# Patient Record
Sex: Female | Born: 1975 | Race: White | Hispanic: No | Marital: Married | Smoking: Never smoker
Health system: Southern US, Community
[De-identification: ages and names within clinical notes are randomized; demographics above are authoritative.]

## PROBLEM LIST (undated history)

## (undated) HISTORY — PX: NO PAST SURGERIES: SHX2092

---

## 2012-03-28 ENCOUNTER — Ambulatory Visit: Payer: Self-pay | Admitting: Cardiovascular Disease

## 2015-05-21 DIAGNOSIS — G8929 Other chronic pain: Secondary | ICD-10-CM | POA: Insufficient documentation

## 2015-05-21 DIAGNOSIS — D500 Iron deficiency anemia secondary to blood loss (chronic): Secondary | ICD-10-CM | POA: Insufficient documentation

## 2015-05-21 DIAGNOSIS — M545 Low back pain, unspecified: Secondary | ICD-10-CM | POA: Insufficient documentation

## 2015-05-21 DIAGNOSIS — J301 Allergic rhinitis due to pollen: Secondary | ICD-10-CM | POA: Insufficient documentation

## 2015-05-23 DIAGNOSIS — F5104 Psychophysiologic insomnia: Secondary | ICD-10-CM | POA: Insufficient documentation

## 2015-08-30 DIAGNOSIS — L659 Nonscarring hair loss, unspecified: Secondary | ICD-10-CM | POA: Insufficient documentation

## 2015-12-11 ENCOUNTER — Other Ambulatory Visit: Payer: Self-pay | Admitting: Pediatrics

## 2015-12-11 DIAGNOSIS — Z1231 Encounter for screening mammogram for malignant neoplasm of breast: Secondary | ICD-10-CM

## 2015-12-14 ENCOUNTER — Ambulatory Visit
Admission: EM | Admit: 2015-12-14 | Discharge: 2015-12-14 | Disposition: A | Discharge status: Home / Self Care | Payer: Federal, State, Local not specified - PPO | Attending: Family Medicine | Admitting: Family Medicine

## 2015-12-14 DIAGNOSIS — Z803 Family history of malignant neoplasm of breast: Secondary | ICD-10-CM | POA: Insufficient documentation

## 2015-12-14 DIAGNOSIS — R251 Tremor, unspecified: Secondary | ICD-10-CM | POA: Diagnosis not present

## 2015-12-14 DIAGNOSIS — Z79899 Other long term (current) drug therapy: Secondary | ICD-10-CM | POA: Diagnosis not present

## 2015-12-14 DIAGNOSIS — R200 Anesthesia of skin: Secondary | ICD-10-CM | POA: Insufficient documentation

## 2015-12-14 LAB — BASIC METABOLIC PANEL
ANION GAP: 7 (ref 5–15)
BUN: 13 mg/dL (ref 6–20)
CALCIUM: 8.8 mg/dL — AB (ref 8.9–10.3)
CO2: 24 mmol/L (ref 22–32)
CREATININE: 0.66 mg/dL (ref 0.44–1.00)
Chloride: 105 mmol/L (ref 101–111)
Glucose, Bld: 93 mg/dL (ref 65–99)
Potassium: 3.7 mmol/L (ref 3.5–5.1)
SODIUM: 136 mmol/L (ref 135–145)

## 2015-12-14 LAB — CBC WITH DIFFERENTIAL/PLATELET
BASOS ABS: 0 10*3/uL (ref 0–0.1)
BASOS PCT: 1 %
EOS ABS: 0 10*3/uL (ref 0–0.7)
Eosinophils Relative: 1 %
HEMATOCRIT: 31.8 % — AB (ref 35.0–47.0)
Hemoglobin: 10.4 g/dL — ABNORMAL LOW (ref 12.0–16.0)
Lymphocytes Relative: 25 %
Lymphs Abs: 1.1 10*3/uL (ref 1.0–3.6)
MCH: 27.5 pg (ref 26.0–34.0)
MCHC: 32.5 g/dL (ref 32.0–36.0)
MCV: 84.7 fL (ref 80.0–100.0)
MONO ABS: 0.3 10*3/uL (ref 0.2–0.9)
MONOS PCT: 8 %
NEUTROS ABS: 2.8 10*3/uL (ref 1.4–6.5)
Neutrophils Relative %: 65 %
PLATELETS: 188 10*3/uL (ref 150–440)
RBC: 3.76 MIL/uL — ABNORMAL LOW (ref 3.80–5.20)
RDW: 14.8 % — AB (ref 11.5–14.5)
WBC: 4.2 10*3/uL (ref 3.6–11.0)

## 2015-12-14 LAB — GLUCOSE, CAPILLARY: GLUCOSE-CAPILLARY: 129 mg/dL — AB (ref 65–99)

## 2015-12-14 NOTE — ED Provider Notes (Addendum)
[facility]    CSN: [id number] Arrival date & time: 12/14/15  1345     History   Chief Complaint Chief Complaint  Patient presents with  . Shaking    HPI Julia Hendrix is a 40 y.o. female.   40 yo female with a c/o "shakiness" this morning. States that she has some facial numbness bilaterally and above eyes. Patient states that she feels that she feels like her whole body is falling asleep. Denies vision changes, one-sided weakness, fevers, chills. States normally doesn't drink [employer] coffee and this morning had a strong cofffee drink there. Denies syncope, chest pains, shortness of breath.             The history is provided by the patient.    History reviewed. No pertinent past medical history.  There are no active problems to display for this patient.   Past Surgical History:  Procedure Laterality Date  . NO PAST SURGERIES      OB History    No data available       Home Medications    Prior to Admission medications   Medication Sig Start Date End Date Taking? Authorizing Provider  escitalopram (LEXAPRO) 10 MG tablet Take 10 mg by mouth daily.   Yes Historical Provider, MD    Family History Family History  Problem Relation Age of Onset  . Breast cancer Maternal Aunt 60    Social History Social History  Substance Use Topics  . Smoking status: Never Smoker  . Smokeless tobacco: Never Used  . Alcohol use Yes     Comment: red wine     Allergies   Review of patient's allergies indicates no known allergies.   Review of Systems Review of Systems   Physical Exam Triage Vital Signs ED Triage Vitals  Enc Vitals Group     BP 12/14/15 1348 132/81     Pulse Rate 12/14/15 1348 94     Resp 12/14/15 1348 20     Temp 12/14/15 1348 98.2 F (36.8 C)     Temp Source 12/14/15 1348 Tympanic     SpO2 12/14/15 1348 100 %     Weight 12/14/15 1348 140 lb (63.5 kg)     Height 12/14/15 1348 5\' 9"  (1.753 m)     Head Circumference --     Peak Flow --      Pain Score 12/14/15 1350 0     Pain Loc --      Pain Edu? --      Excl. in [place]? --    No data found.   Updated Vital Signs BP 132/81 (BP Location: Left Arm)   Pulse 94   Temp 98.2 F (36.8 C) (Tympanic)   Resp 20   Ht 5\' 9"  (1.753 m)   Wt 140 lb (63.5 kg)   LMP 11/30/2015   SpO2 100%   BMI 20.67 kg/m   Visual Acuity Right Eye Distance:   Left Eye Distance:   Bilateral Distance:    Right Eye Near:   Left Eye Near:    Bilateral Near:     Physical Exam  Constitutional: She is oriented to person, place, and time. She appears well-developed and well-nourished. No distress.  HENT:  Head: Normocephalic and atraumatic.  Right Ear: Tympanic membrane, external ear and ear canal normal.  Left Ear: Tympanic membrane, external ear and ear canal normal.  Nose: Mucosal edema and rhinorrhea present. No nose lacerations, sinus tenderness, nasal deformity, septal deviation or nasal septal  hematoma. No epistaxis.  No foreign bodies. Right sinus exhibits maxillary sinus tenderness and frontal sinus tenderness. Left sinus exhibits maxillary sinus tenderness and frontal sinus tenderness.  Mouth/Throat: Uvula is midline, oropharynx is clear and moist and mucous membranes are normal. No oropharyngeal exudate.  Eyes: Conjunctivae and EOM are normal. Pupils are equal, round, and reactive to light. Right eye exhibits no discharge. Left eye exhibits no discharge. No scleral icterus.  Neck: Normal range of motion. Neck supple. No thyromegaly present.  Cardiovascular: Normal rate, regular rhythm and normal heart sounds.   Pulmonary/Chest: Effort normal and breath sounds normal. No respiratory distress. She has no wheezes. She has no rales.  Lymphadenopathy:    She has no cervical adenopathy.  Neurological: She is alert and oriented to person, place, and time. She has normal reflexes. She displays normal reflexes. No cranial nerve deficit. She exhibits normal muscle tone. Coordination  normal.  Skin: No rash noted. She is not diaphoretic.  Nursing note and vitals reviewed.    UC Treatments / Results  Labs (all labs ordered are listed, but only abnormal results are displayed) Labs Reviewed  GLUCOSE, CAPILLARY - Abnormal; Notable for the following:       Result Value   Glucose-Capillary 129 (*)    All other components within normal limits  CBC WITH DIFFERENTIAL/PLATELET - Abnormal; Notable for the following:    RBC 3.76 (*)    Hemoglobin 10.4 (*)    HCT 31.8 (*)    RDW 14.8 (*)    All other components within normal limits  BASIC METABOLIC PANEL - Abnormal; Notable for the following:    Calcium 8.8 (*)    All other components within normal limits  CBG MONITORING, ED    EKG  EKG Interpretation None       Radiology No results found.  Procedures .EKG Date/Time: 12/25/2015 8:58 PM Performed by: Payton MccallumONTY, Loomis Anacker Authorized by: Payton MccallumONTY, Nakyah Erdmann   ECG reviewed by ED Physician in the absence of a cardiologist: yes   Previous ECG:    Previous ECG:  Unavailable Interpretation:    Interpretation: normal   Rate:    ECG rate assessment: normal   Rhythm:    Rhythm: sinus rhythm   Ectopy:    Ectopy: none   QRS:    QRS axis:  Normal Conduction:    Conduction: normal   ST segments:    ST segments:  Normal T waves:    T waves: normal     (including critical care time)  Medications Ordered in UC Medications - No data to display   Initial Impression / Assessment and Plan / UC Course  I have reviewed the triage vital signs and the nursing notes.  Pertinent labs & imaging results that were available during my care of the patient were reviewed by me and considered in my medical decision making (see chart for details).  Clinical Course     Final Clinical Impressions(s) / UC Diagnoses   Final diagnoses:  Shakiness  (possibly secondary to caffeine?)  New Prescriptions Discharge Medication List as of 12/14/2015  2:46 PM     1. Lab results and  diagnosis reviewed with patient 2.Recommend supportive treatment with increased fluids, rest 4. Follow-up with PCP or prn if symptoms worsen or don't improve   Payton Mccallumrlando Rance Smithson, MD 12/25/15 [id number]    Payton Mccallumrlando Allie Ousley, MD 12/25/15 2059

## 2015-12-14 NOTE — ED Triage Notes (Signed)
Patient complains of shakiness that started this morning. Patient states that she has some facial numbness bilaterally and above eyes. Patient states that she has had some base of her neck pain. Patient states that she feels that she feels like her whole body is falling asleep.

## 2015-12-18 ENCOUNTER — Ambulatory Visit
Admission: RE | Admit: 2015-12-18 | Discharge: 2015-12-18 | Disposition: A | Discharge status: Home / Self Care | Payer: Federal, State, Local not specified - PPO | Source: Ambulatory Visit | Attending: Pediatrics | Admitting: Pediatrics

## 2015-12-18 ENCOUNTER — Other Ambulatory Visit: Payer: Self-pay | Admitting: Pediatrics

## 2015-12-18 DIAGNOSIS — Z1231 Encounter for screening mammogram for malignant neoplasm of breast: Secondary | ICD-10-CM | POA: Diagnosis not present

## 2016-04-23 HISTORY — PX: INTRAUTERINE DEVICE (IUD) INSERTION: SHX5877

## 2016-05-12 DIAGNOSIS — Z975 Presence of (intrauterine) contraceptive device: Secondary | ICD-10-CM | POA: Insufficient documentation

## 2016-05-21 ENCOUNTER — Encounter: Payer: Self-pay | Admitting: Advanced Practice Midwife

## 2016-05-21 ENCOUNTER — Ambulatory Visit (INDEPENDENT_AMBULATORY_CARE_PROVIDER_SITE_OTHER): Payer: Federal, State, Local not specified - PPO | Admitting: Advanced Practice Midwife

## 2016-05-21 VITALS — BP 116/54 | HR 75 | Ht 69.0 in | Wt 156.0 lb

## 2016-05-21 DIAGNOSIS — Z30431 Encounter for routine checking of intrauterine contraceptive device: Secondary | ICD-10-CM

## 2016-05-21 DIAGNOSIS — F419 Anxiety disorder, unspecified: Secondary | ICD-10-CM | POA: Insufficient documentation

## 2016-05-21 NOTE — Progress Notes (Signed)
       IUD String Check  Subjctive: Julia Hendrix presents for IUD string check.  She had a Mirena placed 4 weeks ago.  Since placement of her IUD she had 13 days vaginal bleeding.  She denies cramping or discomfort.  She has had intercourse since placement.  She has not checked the strings.  She denies any fever, chills, nausea, vomiting, or other complaints.    Objective: BP (!) 116/54 (BP Location: Left Arm, Patient Position: Sitting, Cuff Size: Normal)   Pulse 75   Ht 5\' 9"  (1.753 m)   Wt 156 lb (70.8 kg)   LMP 05/09/2016 (Exact Date)   BMI 23.04 kg/m  Gen:  NAD, A&Ox3 HEENT: normocephalic, anicteric Pulmonary: no increased work of breathing Pelvic: Normal appearing external female genitalia, normal vaginal epithelium, no abnormal discharge. Normal appearing cervix.  IUD strings visible and 3 cm in length similar to at the time of placement. Psychiatric: mood appropriate, affect full Neurologic: grossly normal  Assessment: 41 y.o. year old female status post prior Mirena IUD placement 4 weeks ago, doing well.  Plan: 1.  The patient was given instructions to check her IUD strings monthly and call with any problems or concerns.  She should call for fevers, chills, abnormal vaginal discharge, pelvic pain, or other complaints. 2.  She will return for a annual exam in 1 year.  All questions answered.  Julia Hendrix, CNM 05/21/2016 1:59 PM

## 2017-01-19 ENCOUNTER — Other Ambulatory Visit: Payer: Self-pay | Admitting: Pediatrics

## 2017-01-19 DIAGNOSIS — Z1231 Encounter for screening mammogram for malignant neoplasm of breast: Secondary | ICD-10-CM

## 2017-02-09 ENCOUNTER — Encounter (INDEPENDENT_AMBULATORY_CARE_PROVIDER_SITE_OTHER): Payer: Self-pay

## 2017-02-09 ENCOUNTER — Ambulatory Visit
Admission: RE | Admit: 2017-02-09 | Discharge: 2017-02-09 | Disposition: A | Discharge status: Home / Self Care | Payer: Federal, State, Local not specified - PPO | Source: Ambulatory Visit | Attending: Pediatrics | Admitting: Pediatrics

## 2017-02-09 DIAGNOSIS — Z1231 Encounter for screening mammogram for malignant neoplasm of breast: Secondary | ICD-10-CM | POA: Diagnosis not present

## 2017-03-04 DIAGNOSIS — T691XXA Chilblains, initial encounter: Secondary | ICD-10-CM | POA: Insufficient documentation

## 2017-03-04 DIAGNOSIS — L301 Dyshidrosis [pompholyx]: Secondary | ICD-10-CM | POA: Insufficient documentation

## 2018-04-05 ENCOUNTER — Other Ambulatory Visit: Payer: Self-pay | Admitting: Pediatrics

## 2018-04-05 DIAGNOSIS — Z1231 Encounter for screening mammogram for malignant neoplasm of breast: Secondary | ICD-10-CM

## 2018-04-07 ENCOUNTER — Ambulatory Visit
Admission: RE | Admit: 2018-04-07 | Discharge: 2018-04-07 | Disposition: A | Discharge status: Home / Self Care | Payer: Federal, State, Local not specified - PPO | Source: Ambulatory Visit | Attending: Pediatrics | Admitting: Pediatrics

## 2018-04-07 ENCOUNTER — Encounter (INDEPENDENT_AMBULATORY_CARE_PROVIDER_SITE_OTHER): Payer: Self-pay

## 2018-04-07 DIAGNOSIS — Z1231 Encounter for screening mammogram for malignant neoplasm of breast: Secondary | ICD-10-CM | POA: Insufficient documentation

## 2018-04-18 ENCOUNTER — Ambulatory Visit: Payer: Federal, State, Local not specified - PPO | Admitting: Advanced Practice Midwife

## 2019-05-15 ENCOUNTER — Other Ambulatory Visit: Payer: Self-pay | Admitting: Pediatrics

## 2019-05-15 DIAGNOSIS — Z1231 Encounter for screening mammogram for malignant neoplasm of breast: Secondary | ICD-10-CM

## 2019-05-17 ENCOUNTER — Other Ambulatory Visit: Payer: Self-pay

## 2019-05-17 ENCOUNTER — Ambulatory Visit
Admission: RE | Admit: 2019-05-17 | Discharge: 2019-05-17 | Disposition: A | Discharge status: Home / Self Care | Payer: Federal, State, Local not specified - PPO | Source: Ambulatory Visit | Attending: Pediatrics | Admitting: Pediatrics

## 2019-05-17 DIAGNOSIS — Z1231 Encounter for screening mammogram for malignant neoplasm of breast: Secondary | ICD-10-CM | POA: Diagnosis not present

## 2019-06-22 ENCOUNTER — Other Ambulatory Visit (HOSPITAL_COMMUNITY)
Admission: RE | Admit: 2019-06-22 | Discharge: 2019-06-22 | Disposition: A | Discharge status: Home / Self Care | Payer: Federal, State, Local not specified - PPO | Source: Ambulatory Visit | Attending: Advanced Practice Midwife | Admitting: Advanced Practice Midwife

## 2019-06-22 ENCOUNTER — Other Ambulatory Visit: Payer: Self-pay

## 2019-06-22 ENCOUNTER — Ambulatory Visit (INDEPENDENT_AMBULATORY_CARE_PROVIDER_SITE_OTHER): Payer: Federal, State, Local not specified - PPO | Admitting: Advanced Practice Midwife

## 2019-06-22 ENCOUNTER — Encounter: Payer: Self-pay | Admitting: Advanced Practice Midwife

## 2019-06-22 VITALS — BP 142/78 | HR 86 | Ht 69.0 in | Wt 174.0 lb

## 2019-06-22 DIAGNOSIS — Z01419 Encounter for gynecological examination (general) (routine) without abnormal findings: Secondary | ICD-10-CM | POA: Insufficient documentation

## 2019-06-22 DIAGNOSIS — Z124 Encounter for screening for malignant neoplasm of cervix: Secondary | ICD-10-CM | POA: Insufficient documentation

## 2019-06-22 NOTE — Progress Notes (Signed)
Gynecology Annual Exam  PCP: Barbaraann Boys, MD  Chief Complaint:  Chief Complaint  Patient presents with  . Gynecologic Exam    History of Present Illness: Patient is a 44 y.o. G2P2 presents for annual exam. The patient has no gyn complaints today. She has had Mirena IUD for 3 years. Initially she had no bleeding. In recent months she has had 1 day of a few spots monthly. She is very happy with her IUD since it is controlling her heavy bleeding.   LMP: No LMP recorded. (Menstrual status: IUD).  Intermenstrual Bleeding: no Postcoital Bleeding: no Dysmenorrhea: no   The patient is sexually active. She currently uses IUD for contraception. She denies dyspareunia.  The patient does perform self breast exams.  There is no notable family history of breast or ovarian cancer in her family.  The patient wears seatbelts: yes.   The patient has regular exercise: she admits mostly sedentary lifestyle in the past year since working from home. She has recently joined a gym and plans to start going. She admits healthy diet and adequate hydration. She has always had a difficult time sleeping and get about "2 good hours per night".    The patient denies current symptoms of depression.    Review of Systems: Review of Systems  Constitutional: Negative.   HENT: Negative.   Eyes: Negative.   Respiratory: Negative.   Cardiovascular: Negative.   Gastrointestinal: Negative.   Genitourinary: Negative.   Musculoskeletal: Negative.   Skin: Negative.   Neurological: Negative.   Endo/Heme/Allergies: Negative.   Psychiatric/Behavioral: Negative.     Past Medical History:  Patient Active Problem List   Diagnosis Date Noted  . Anxiety 05/21/2016  . Alopecia 08/30/2015  . Psychophysiological insomnia 05/23/2015  . Chronic midline low back pain without sciatica 05/21/2015  . Iron deficiency anemia due to chronic blood loss 05/21/2015  . Seasonal allergic rhinitis due to pollen 05/21/2015     Past Surgical History:  Past Surgical History:  Procedure Laterality Date  . NO PAST SURGERIES      Gynecologic History:  No LMP recorded. (Menstrual status: IUD). Contraception: IUD Last Pap: 4 years ago Results were:  no abnormalities  Last mammogram: 1 month ago Results were: BI-RAD I  Obstetric History: G2P2  Family History:  Family History  Problem Relation Age of Onset  . Breast cancer Maternal Aunt 72  . Colon cancer Paternal Grandmother     Social History:  Social History   Socioeconomic History  . Marital status: Married    Spouse name: Not on file  . Number of children: Not on file  . Years of education: Not on file  . Highest education level: Not on file  Occupational History  . Not on file  Tobacco Use  . Smoking status: Never Smoker  . Smokeless tobacco: Never Used  Substance and Sexual Activity  . Alcohol use: Yes    Comment: red wine  . Drug use: No  . Sexual activity: Yes    Birth control/protection: I.U.D.  Other Topics Concern  . Not on file  Social History Narrative  . Not on file   Social Determinants of Health   Financial Resource Strain:   . Difficulty of Paying Living Expenses:   Food Insecurity:   . Worried About Charity fundraiser in the Last Year:   . Arboriculturist in the Last Year:   Transportation Needs:   . Film/video editor (Medical):   Marland Kitchen  Lack of Transportation (Non-Medical):   Physical Activity:   . Days of Exercise per Week:   . Minutes of Exercise per Session:   Stress:   . Feeling of Stress :   Social Connections:   . Frequency of Communication with Friends and Family:   . Frequency of Social Gatherings with Friends and Family:   . Attends Religious Services:   . Active Member of Clubs or Organizations:   . Attends Banker Meetings:   Marland Kitchen Marital Status:   Intimate Partner Violence:   . Fear of Current or Ex-Partner:   . Emotionally Abused:   Marland Kitchen Physically Abused:   . Sexually Abused:      Allergies:  No Known Allergies  Medications: Prior to Admission medications   Medication Sig Start Date End Date Taking? Authorizing Provider  cetirizine (ZYRTEC) 10 MG tablet Take by mouth.   Yes [provider]  DULoxetine (CYMBALTA) 20 MG capsule Take by mouth. 04/13/16 06/22/19 Yes [provider]  levonorgestrel (MIRENA) 20 MCG/24HR IUD 1 each by Intrauterine route once.   Yes Tresea Mall, CNM    Physical Exam Vitals: Blood pressure (!) 142/78, pulse 86, height 5\' 9"  (1.753 m), weight 174 lb (78.9 kg).  General: NAD HEENT: normocephalic, anicteric Thyroid: no enlargement, no palpable nodules Pulmonary: No increased work of breathing, CTAB Cardiovascular: RRR, distal pulses 2+ Breast: Breast symmetrical, no tenderness, no palpable nodules or masses, no skin or nipple retraction present, no nipple discharge.  No axillary or supraclavicular lymphadenopathy. Abdomen: NABS, soft, non-tender, non-distended.  Umbilicus without lesions.  No hepatomegaly, splenomegaly or masses palpable. No evidence of hernia  Genitourinary:  External: Normal external female genitalia.  Normal urethral meatus, normal Bartholin's and Skene's glands.    Vagina: Normal vaginal mucosa, no evidence of prolapse.    Cervix: Grossly normal in appearance, no bleeding, no CMT, 2 cm IUD strings visible  Uterus: Non-enlarged, mobile, normal contour.    Adnexa: ovaries non-enlarged, no adnexal masses  Rectal: deferred  Lymphatic: no evidence of inguinal lymphadenopathy Extremities: no edema, erythema, or tenderness Neurologic: Grossly intact Psychiatric: mood appropriate, affect full    Assessment: 44 y.o. G2P2 routine annual exam  Plan: Problem List Items Addressed This Visit    None    Visit Diagnoses    Well woman exam with routine gynecological exam    -  Primary   Relevant Orders   Cytology - PAP   Routine cervical smear       Relevant Orders   Cytology - PAP      1)  Mammogram - recommend yearly screening mammogram.  Mammogram Is up to date   2) STI screening  was offered and declined  3) ASCCP guidelines and rationale discussed.  Patient opts for 3-5 years screening interval  4) Contraception - the patient is currently using  IUD.  She is happy with her current form of contraception and plans to continue  5) Colonoscopy -- Screening recommended starting at age 61 for average risk individuals, age 53 for individuals deemed at increased risk (including African Americans) and recommended to continue until age 79.  For patient age 53-85 individualized approach is recommended.  Gold standard screening is via colonoscopy, Cologuard screening is an acceptable alternative for patient unwilling or unable to undergo colonoscopy.  "Colorectal cancer screening for average?risk adults: 2018 guideline update from the American Cancer Society"CA: A Cancer Journal for Clinicians: Jul 29, 2016   6) Routine healthcare maintenance including cholesterol, diabetes screening discussed  Declines   7) Increase healthy lifestyle: exercise  8) Return in about 1 year (around 06/21/2020) for annual established gyn.   Tresea Mall, CNM [facility] 06/22/2019, 9:22 AM

## 2019-06-26 LAB — CYTOLOGY - PAP
Comment: NEGATIVE
Diagnosis: NEGATIVE
High risk HPV: NEGATIVE

## 2019-06-26 NOTE — Addendum Note (Signed)
Addended by: Tresea Mall on: 06/26/2019 01:00 PM   Modules accepted: Level of Service

## 2020-11-27 ENCOUNTER — Other Ambulatory Visit: Payer: Self-pay | Admitting: Pediatrics

## 2020-11-27 DIAGNOSIS — Z1231 Encounter for screening mammogram for malignant neoplasm of breast: Secondary | ICD-10-CM

## 2020-12-12 ENCOUNTER — Other Ambulatory Visit: Payer: Self-pay

## 2020-12-12 ENCOUNTER — Ambulatory Visit
Admission: RE | Admit: 2020-12-12 | Discharge: 2020-12-12 | Disposition: A | Discharge status: Home / Self Care | Payer: Federal, State, Local not specified - PPO | Source: Ambulatory Visit | Attending: Pediatrics | Admitting: Pediatrics

## 2020-12-12 DIAGNOSIS — Z1231 Encounter for screening mammogram for malignant neoplasm of breast: Secondary | ICD-10-CM | POA: Diagnosis not present

## 2020-12-19 ENCOUNTER — Other Ambulatory Visit: Payer: Self-pay

## 2020-12-19 ENCOUNTER — Ambulatory Visit
Admission: RE | Admit: 2020-12-19 | Discharge: 2020-12-19 | Disposition: A | Discharge status: Home / Self Care | Payer: Federal, State, Local not specified - PPO | Source: Ambulatory Visit | Attending: Pediatrics | Admitting: Pediatrics

## 2020-12-19 ENCOUNTER — Other Ambulatory Visit: Payer: Self-pay | Admitting: Pediatrics

## 2020-12-19 DIAGNOSIS — N6489 Other specified disorders of breast: Secondary | ICD-10-CM | POA: Diagnosis present

## 2020-12-19 DIAGNOSIS — R928 Other abnormal and inconclusive findings on diagnostic imaging of breast: Secondary | ICD-10-CM | POA: Insufficient documentation

## 2020-12-24 ENCOUNTER — Other Ambulatory Visit: Payer: Self-pay | Admitting: Pediatrics

## 2020-12-24 DIAGNOSIS — N6489 Other specified disorders of breast: Secondary | ICD-10-CM

## 2020-12-24 DIAGNOSIS — R928 Other abnormal and inconclusive findings on diagnostic imaging of breast: Secondary | ICD-10-CM

## 2020-12-27 ENCOUNTER — Ambulatory Visit
Admission: RE | Admit: 2020-12-27 | Discharge: 2020-12-27 | Disposition: A | Discharge status: Home / Self Care | Payer: Federal, State, Local not specified - PPO | Source: Ambulatory Visit | Attending: Pediatrics | Admitting: Pediatrics

## 2020-12-27 ENCOUNTER — Other Ambulatory Visit: Payer: Self-pay

## 2020-12-27 DIAGNOSIS — N6489 Other specified disorders of breast: Secondary | ICD-10-CM

## 2020-12-27 DIAGNOSIS — R928 Other abnormal and inconclusive findings on diagnostic imaging of breast: Secondary | ICD-10-CM | POA: Insufficient documentation

## 2020-12-30 LAB — SURGICAL PATHOLOGY

## 2021-07-09 ENCOUNTER — Other Ambulatory Visit: Payer: Self-pay | Admitting: Pediatrics

## 2021-07-11 ENCOUNTER — Other Ambulatory Visit: Payer: Self-pay | Admitting: Pediatrics

## 2021-07-11 DIAGNOSIS — N6489 Other specified disorders of breast: Secondary | ICD-10-CM

## 2021-08-01 ENCOUNTER — Ambulatory Visit
Admission: RE | Admit: 2021-08-01 | Discharge: 2021-08-01 | Disposition: A | Discharge status: Home / Self Care | Payer: Federal, State, Local not specified - PPO | Source: Ambulatory Visit | Attending: Pediatrics | Admitting: Pediatrics

## 2021-08-01 DIAGNOSIS — N6489 Other specified disorders of breast: Secondary | ICD-10-CM

## 2021-10-31 ENCOUNTER — Other Ambulatory Visit: Payer: Self-pay | Admitting: Pediatrics

## 2021-10-31 DIAGNOSIS — Z1231 Encounter for screening mammogram for malignant neoplasm of breast: Secondary | ICD-10-CM

## 2021-12-16 ENCOUNTER — Ambulatory Visit
Admission: RE | Admit: 2021-12-16 | Discharge: 2021-12-16 | Disposition: A | Discharge status: Home / Self Care | Payer: Federal, State, Local not specified - PPO | Source: Ambulatory Visit | Attending: Pediatrics | Admitting: Pediatrics

## 2021-12-16 DIAGNOSIS — Z1231 Encounter for screening mammogram for malignant neoplasm of breast: Secondary | ICD-10-CM | POA: Diagnosis present

## 2022-03-12 ENCOUNTER — Ambulatory Visit
Admission: EM | Admit: 2022-03-12 | Discharge: 2022-03-12 | Disposition: A | Discharge status: Home / Self Care | Payer: Federal, State, Local not specified - PPO | Attending: Emergency Medicine | Admitting: Emergency Medicine

## 2022-03-12 ENCOUNTER — Ambulatory Visit: Payer: Federal, State, Local not specified - PPO

## 2022-03-12 ENCOUNTER — Ambulatory Visit (INDEPENDENT_AMBULATORY_CARE_PROVIDER_SITE_OTHER): Payer: Federal, State, Local not specified - PPO

## 2022-03-12 ENCOUNTER — Encounter: Payer: Self-pay | Admitting: Emergency Medicine

## 2022-03-12 DIAGNOSIS — M79672 Pain in left foot: Secondary | ICD-10-CM | POA: Diagnosis not present

## 2022-03-12 NOTE — Discharge Instructions (Signed)
X-rays negative for injury to the bone, symptoms should slowly improve with time  You may take Tylenol 500 to 1000 mg every 6 hours as needed for pain, may be used in addition ibuprofen  You may take ibuprofen 600 to 800 mg every 6-8 hours to help reduce swelling and for pain  You may use ice or heat over the affected area in 10 to 15-minute intervals  You may elevate foot when sitting and lying to help reduce swelling  You may use compression wrap as needed when completing activities to give stability and support  If your symptoms persist past 2 weeks with no improvement seen please follow-up with orthopedics for reevaluation

## 2022-03-12 NOTE — ED Provider Notes (Signed)
[facility]    CSN: [id number] Arrival date & time: 03/12/22  1252      History   Chief Complaint Chief Complaint  Patient presents with   Ankle Pain   Foot Pain    HPI Tena Linebaugh is a 47 y.o. female.   Patient presents for evaluation of left foot pain and swelling beginning 1 day ago after externally rotating ankle during a misstep while walking.  Has been on but able to bear weight since, has been heel walking to compensate.  Endorses slight tingling to all 5 toes but unsure if related to injury or feet or just cold.  Has attempted use of Tylenol.   History reviewed. No pertinent past medical history.  Patient Active Problem List   Diagnosis Date Noted   Eczema, dyshidrotic 03/04/2017   Chilblains 03/04/2017   Anxiety 05/21/2016   IUD (intrauterine device) in place 05/12/2016   Alopecia 08/30/2015   Psychophysiological insomnia 05/23/2015   Seasonal allergic rhinitis due to pollen 05/21/2015    Past Surgical History:  Procedure Laterality Date   INTRAUTERINE DEVICE (IUD) INSERTION  04/23/2016   [facility]   NO PAST SURGERIES      OB History     Gravida  2   Para  2   Term      Preterm      AB      Living  2      SAB      IAB      Ectopic      Multiple      Live Births               Home Medications    Prior to Admission medications   Medication Sig Start Date End Date Taking? Authorizing Provider  DULoxetine (CYMBALTA) 20 MG capsule Take by mouth. 04/13/16 03/12/22 Yes [provider]  cetirizine (ZYRTEC) 10 MG tablet Take by mouth.    [provider]  levonorgestrel (MIRENA) 20 MCG/24HR IUD 1 each by Intrauterine route once.    Rod Can, CNM    Family History Family History  Problem Relation Age of Onset   Breast cancer Maternal Aunt 72   Colon cancer Paternal Grandmother     Social History Social History   Tobacco Use   Smoking status: Never   Smokeless tobacco: Never  Vaping  Use   Vaping Use: Never used  Substance Use Topics   Alcohol use: Yes    Comment: red wine   Drug use: No     Allergies   Patient has no known allergies.   Review of Systems Review of Systems Defer to HPI    Physical Exam Triage Vital Signs ED Triage Vitals  Enc Vitals Group     BP 03/12/22 1312 128/85     Pulse Rate 03/12/22 1312 72     Resp 03/12/22 1312 16     Temp 03/12/22 1312 98.6 F (37 C)     Temp Source 03/12/22 1312 Oral     SpO2 03/12/22 1312 100 %     Weight --      Height --      Head Circumference --      Peak Flow --      Pain Score 03/12/22 1310 3     Pain Loc --      Pain Edu? --      Excl. in [place]? --    No data found.  Updated Vital Signs BP  128/85 (BP Location: Left Arm)   Pulse 72   Temp 98.6 F (37 C) (Oral)   Resp 16   SpO2 100%   Visual Acuity Right Eye Distance:   Left Eye Distance:   Bilateral Distance:    Right Eye Near:   Left Eye Near:    Bilateral Near:     Physical Exam Constitutional:      Appearance: Normal appearance.  Eyes:     Extraocular Movements: Extraocular movements intact.  Pulmonary:     Effort: Pulmonary effort is normal.  Musculoskeletal:     Comments: Moderate swelling, tenderness and ecchymosis present to the dorsum aspect of the midfoot primarily to the left, no point tenderness noted, able to bear weight but pain elicited, has full range of motion of the left ankle, 2+ pedal pulse, sensation to the toes are intact, capillary refills are less than 3  Neurological:     Mental Status: She is alert.      UC Treatments / Results  Labs (all labs ordered are listed, but only abnormal results are displayed) Labs Reviewed - No data to display  EKG   Radiology No results found.  Procedures Procedures (including critical care time)  Medications Ordered in UC Medications - No data to display  Initial Impression / Assessment and Plan / UC Course  I have reviewed the triage vital signs and the  nursing notes.  Pertinent labs & imaging results that were available during my care of the patient were reviewed by me and considered in my medical decision making (see chart for details).  Left foot pain  X-ray negative, discussed findings, recommended over-the-counter analgesics and supportive care through [facility], may continue activity as tolerated if symptoms persist past 2 weeks recommend Ortho follow-up Final Clinical Impressions(s) / UC Diagnoses   Final diagnoses:  None   Discharge Instructions   None    ED Prescriptions   None    PDMP not reviewed this encounter.   Hans Eden, NP 03/12/22 1402

## 2022-03-12 NOTE — ED Triage Notes (Signed)
Pt mis- stepped yesterday and injured her left foot and ankle

## 2022-11-23 ENCOUNTER — Other Ambulatory Visit: Payer: Self-pay | Admitting: Pediatrics

## 2022-11-23 DIAGNOSIS — Z1231 Encounter for screening mammogram for malignant neoplasm of breast: Secondary | ICD-10-CM

## 2022-12-22 ENCOUNTER — Ambulatory Visit
Admission: RE | Admit: 2022-12-22 | Discharge: 2022-12-22 | Disposition: A | Discharge status: Home / Self Care | Payer: Federal, State, Local not specified - PPO | Source: Ambulatory Visit | Attending: Pediatrics | Admitting: Pediatrics

## 2022-12-22 DIAGNOSIS — Z1231 Encounter for screening mammogram for malignant neoplasm of breast: Secondary | ICD-10-CM | POA: Diagnosis present

## 2023-01-27 IMAGING — MG MM DIGITAL DIAGNOSTIC UNILAT*R* W/ TOMO W/ CAD
6 series · 6 of 18 positions shown · non-contrast
Comparison: Previous exam(s).

CLINICAL DATA: Status post ultrasound-guided biopsy of a RIGHT
breast developing asymmetry which demonstrated benign PASH and
fibroadenomatoid change. One time six-month follow-up was
recommended.

EXAM:
DIGITAL DIAGNOSTIC UNILATERAL RIGHT MAMMOGRAM WITH TOMOSYNTHESIS AND
CAD
TECHNIQUE: Right digital diagnostic mammography and breast tomosynthesis was
performed. The images were evaluated with computer-aided detection.

[R CC synth-2D]
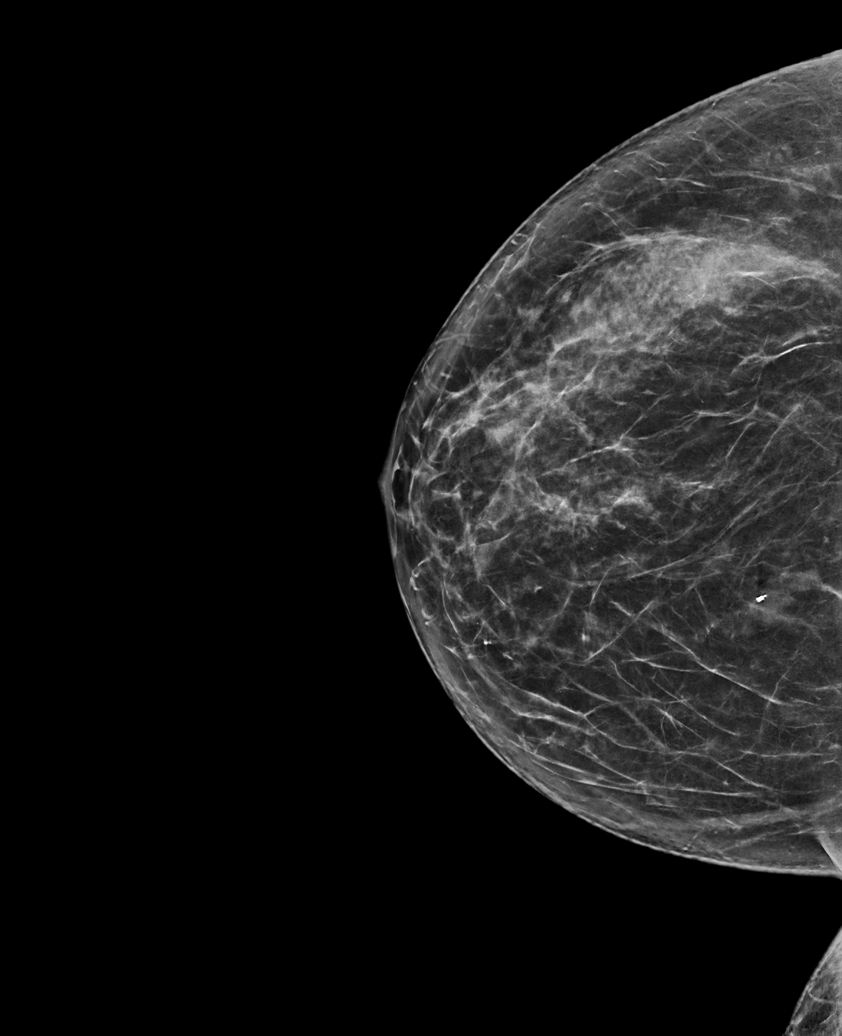

[R MLO synth-2D (1 of 2)]
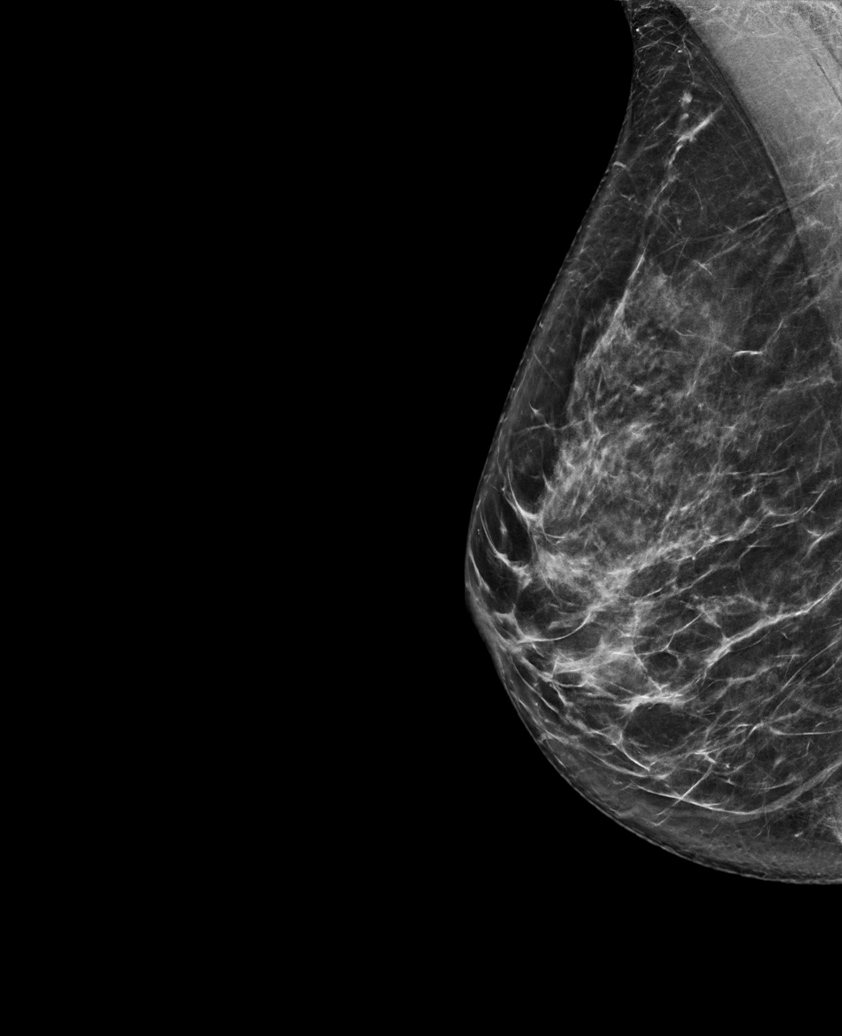

[R MLO synth-2D (2 of 2)]
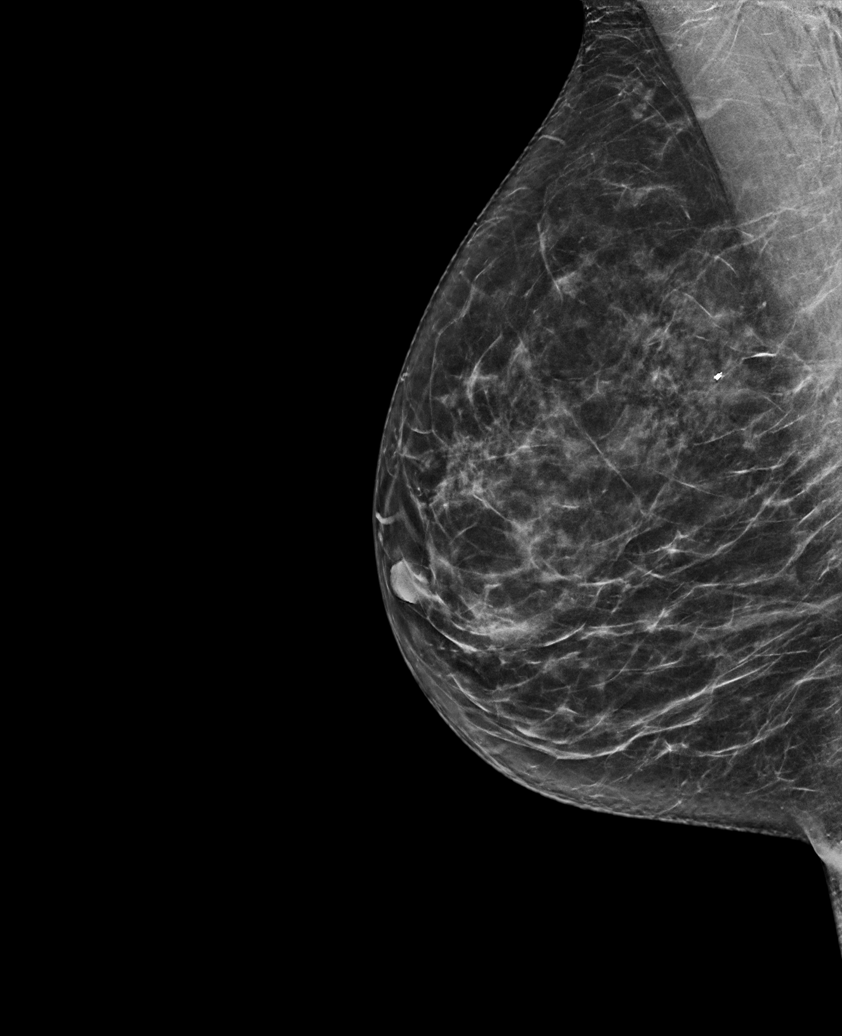

[R MLO tomo (1 of 2) · tomo slice 37/73.0]
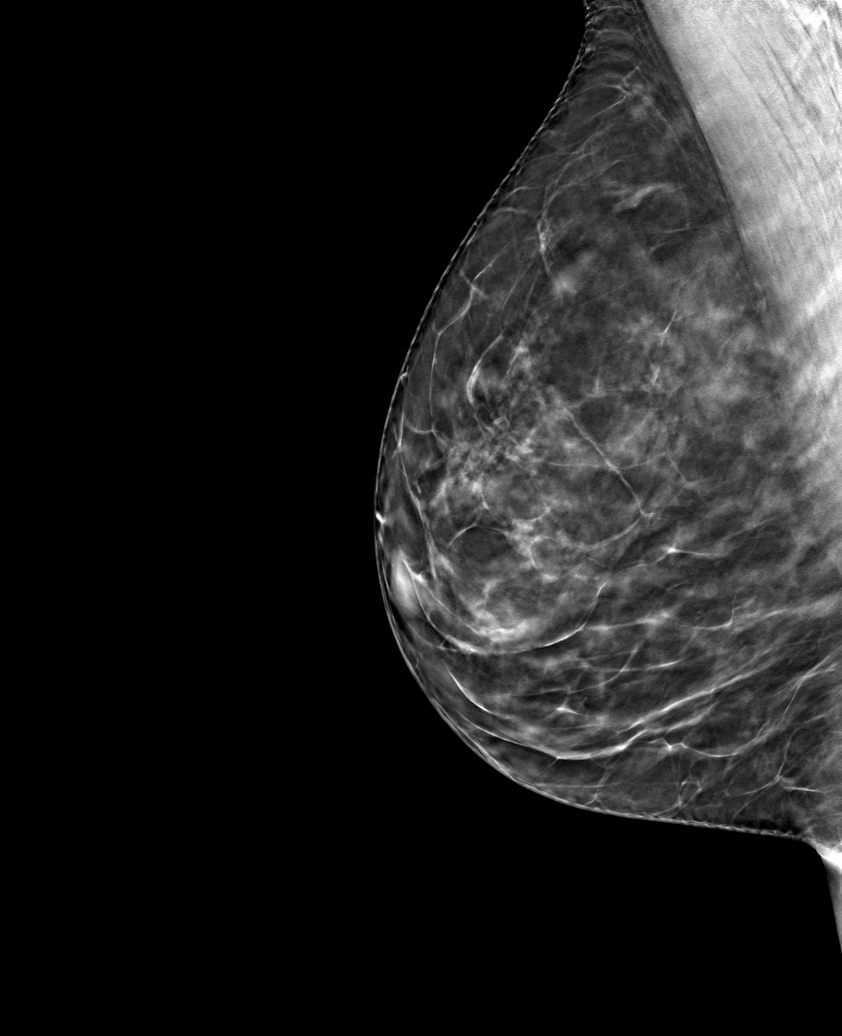

[R CC tomo · tomo slice 37/73.0]
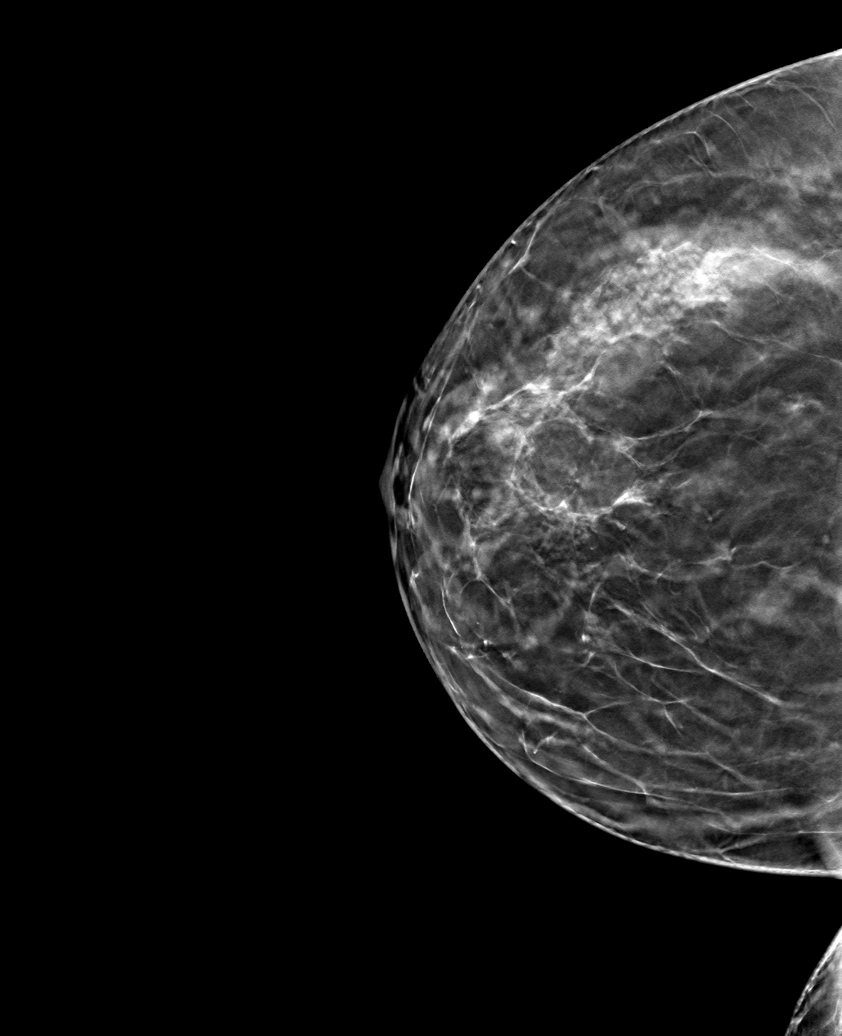

[R MLO tomo (2 of 2) · tomo slice 40/79.0]
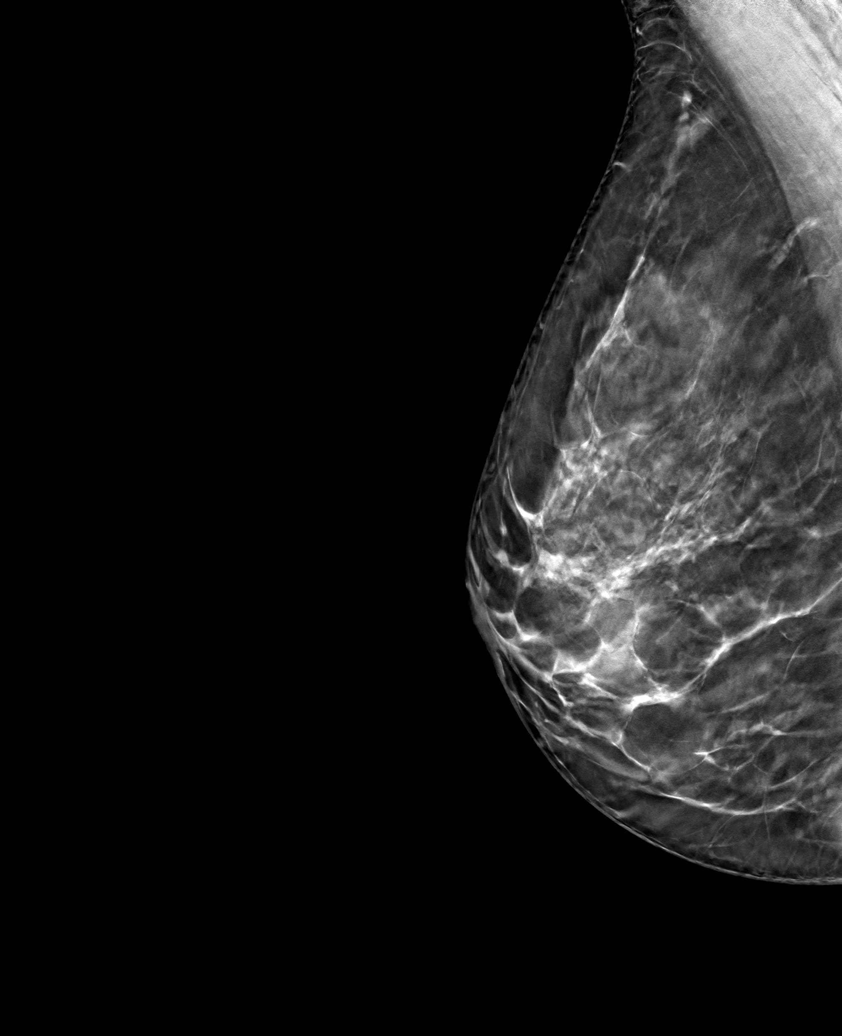

[6 of 18 positions shown; findings below may reference images not displayed]

ACR Breast Density Category c: The breast tissue is heterogeneously
dense, which may obscure small masses.
FINDINGS: Diagnostic images of the RIGHT breast demonstrate mammographic
stability of a RIGHT breast focal asymmetry in the upper inner
breast at posterior depth. COIL clip is within this asymmetry. No
new suspicious findings are noted within the RIGHT breast. No
suspicious mass, distortion, or microcalcifications are identified
to suggest presence of malignancy.
IMPRESSION: No mammographic evidence of malignancy in the RIGHT breast.

RECOMMENDATION:
Recommend return to annual screening mammography, due November 2021.

I have discussed the findings and recommendations with the patient.
If applicable, a reminder letter will be sent to the patient
regarding the next appointment.

BI-RADS CATEGORY  2: Benign.

## 2023-11-22 ENCOUNTER — Other Ambulatory Visit: Payer: Self-pay | Admitting: Pediatrics

## 2023-11-22 DIAGNOSIS — Z1231 Encounter for screening mammogram for malignant neoplasm of breast: Secondary | ICD-10-CM

## 2023-12-23 ENCOUNTER — Ambulatory Visit
Admission: RE | Admit: 2023-12-23 | Discharge: 2023-12-23 | Disposition: A | Discharge status: Home / Self Care | Source: Ambulatory Visit | Attending: Pediatrics | Admitting: Pediatrics

## 2023-12-23 DIAGNOSIS — Z1231 Encounter for screening mammogram for malignant neoplasm of breast: Secondary | ICD-10-CM | POA: Insufficient documentation
